# Patient Record
Sex: Male | Born: 1960 | Race: White | Hispanic: No | Marital: Married | Smoking: Never smoker
Health system: Southern US, Community
[De-identification: ages and names within clinical notes are randomized; demographics above are authoritative.]

## PROBLEM LIST (undated history)

## (undated) DIAGNOSIS — K219 Gastro-esophageal reflux disease without esophagitis: Secondary | ICD-10-CM

## (undated) DIAGNOSIS — I714 Abdominal aortic aneurysm, without rupture, unspecified: Secondary | ICD-10-CM

## (undated) DIAGNOSIS — K5792 Diverticulitis of intestine, part unspecified, without perforation or abscess without bleeding: Secondary | ICD-10-CM

## (undated) DIAGNOSIS — I4891 Unspecified atrial fibrillation: Secondary | ICD-10-CM

## (undated) DIAGNOSIS — K589 Irritable bowel syndrome without diarrhea: Secondary | ICD-10-CM

## (undated) DIAGNOSIS — I471 Supraventricular tachycardia, unspecified: Secondary | ICD-10-CM

## (undated) DIAGNOSIS — T7840XA Allergy, unspecified, initial encounter: Secondary | ICD-10-CM

## (undated) HISTORY — PX: OTHER SURGICAL HISTORY: SHX169

## (undated) HISTORY — PX: CARDIAC ELECTROPHYSIOLOGY STUDY AND ABLATION: SHX1294

## (undated) HISTORY — PX: FRACTURE SURGERY: SHX138

---

## 2006-01-28 ENCOUNTER — Emergency Department (HOSPITAL_COMMUNITY): Admission: EM | Admit: 2006-01-28 | Discharge: 2006-01-29 | Payer: Self-pay | Admitting: Emergency Medicine

## 2006-02-03 ENCOUNTER — Ambulatory Visit (HOSPITAL_BASED_OUTPATIENT_CLINIC_OR_DEPARTMENT_OTHER): Admission: RE | Admit: 2006-02-03 | Discharge: 2006-02-03 | Payer: Self-pay | Admitting: Orthopedic Surgery

## 2006-08-17 ENCOUNTER — Ambulatory Visit: Payer: Self-pay

## 2008-06-04 ENCOUNTER — Ambulatory Visit: Payer: Self-pay | Admitting: Family Medicine

## 2014-09-23 ENCOUNTER — Ambulatory Visit
Admission: RE | Admit: 2014-09-23 | Discharge: 2014-09-23 | Disposition: A | Discharge status: Home / Self Care | Payer: BLUE CROSS/BLUE SHIELD | Source: Ambulatory Visit | Attending: Unknown Physician Specialty | Admitting: Unknown Physician Specialty

## 2014-09-23 ENCOUNTER — Ambulatory Visit: Payer: BLUE CROSS/BLUE SHIELD | Admitting: *Deleted

## 2014-09-23 ENCOUNTER — Encounter
Admission: RE | Disposition: A | Discharge status: Home / Self Care | Payer: Self-pay | Source: Ambulatory Visit | Attending: Unknown Physician Specialty

## 2014-09-23 ENCOUNTER — Encounter: Payer: Self-pay | Admitting: *Deleted

## 2014-09-23 DIAGNOSIS — Z881 Allergy status to other antibiotic agents status: Secondary | ICD-10-CM | POA: Diagnosis not present

## 2014-09-23 DIAGNOSIS — D127 Benign neoplasm of rectosigmoid junction: Secondary | ICD-10-CM | POA: Insufficient documentation

## 2014-09-23 DIAGNOSIS — K640 First degree hemorrhoids: Secondary | ICD-10-CM | POA: Insufficient documentation

## 2014-09-23 DIAGNOSIS — K219 Gastro-esophageal reflux disease without esophagitis: Secondary | ICD-10-CM | POA: Insufficient documentation

## 2014-09-23 DIAGNOSIS — Z1211 Encounter for screening for malignant neoplasm of colon: Secondary | ICD-10-CM

## 2014-09-23 HISTORY — PX: COLONOSCOPY: SHX5424

## 2014-09-23 HISTORY — DX: Unspecified atrial fibrillation: I48.91

## 2014-09-23 SURGERY — COLONOSCOPY
Anesthesia: Monitor Anesthesia Care

## 2014-09-23 MED ORDER — SODIUM CHLORIDE 0.9 % IV SOLN: INTRAVENOUS | Status: DC

## 2014-09-23 MED ORDER — PROPOFOL INFUSION 10 MG/ML OPTIME
INTRAVENOUS | Status: DC | PRN
  Administered 2014-09-23: 140 ug/kg/min via INTRAVENOUS

## 2014-09-23 MED ORDER — PROPOFOL 10 MG/ML IV BOLUS
INTRAVENOUS | Status: DC | PRN
  Administered 2014-09-23: 50 mg via INTRAVENOUS

## 2014-09-23 MED ORDER — LACTATED RINGERS IV SOLN
INTRAVENOUS | Status: DC
  Administered 2014-09-23: 1000 mL via INTRAVENOUS
  Administered 2014-09-23: 12:00:00 via INTRAVENOUS

## 2014-09-23 MED ORDER — PHENYLEPHRINE HCL 10 MG/ML IJ SOLN
INTRAMUSCULAR | Status: DC | PRN
  Administered 2014-09-23 (×2): 100 ug via INTRAVENOUS

## 2014-09-23 MED ORDER — MIDAZOLAM HCL 5 MG/5ML IJ SOLN
INTRAMUSCULAR | Status: DC | PRN
  Administered 2014-09-23 (×2): 1 mg via INTRAVENOUS

## 2014-09-23 MED ORDER — FENTANYL CITRATE (PF) 100 MCG/2ML IJ SOLN
INTRAMUSCULAR | Status: DC | PRN
  Administered 2014-09-23: 50 ug via INTRAVENOUS

## 2014-09-23 NOTE — Anesthesia Postprocedure Evaluation (Signed)
  Anesthesia Post-op Note  Patient: Troy Giles  Procedure(s) Performed: Procedure(s): COLONOSCOPY (N/A)  Anesthesia type:MAC  Patient location: PACU  Post pain: Pain level controlled  Post assessment: Post-op Vital signs reviewed, Patient's Cardiovascular Status Stable, Respiratory Function Stable, Patent Airway and No signs of Nausea or vomiting  Post vital signs: Reviewed and stable  Last Vitals:  Filed Vitals:   09/23/14 1219  BP: 90/50  Pulse: 79  Temp:   Resp: 15    Level of consciousness: awake, alert  and patient cooperative  Complications: No apparent anesthesia complications

## 2014-09-23 NOTE — Anesthesia Preprocedure Evaluation (Signed)
Anesthesia Evaluation  Patient identified by MRN, date of birth, ID band Patient awake    Reviewed: Allergy & Precautions, NPO status , Patient's Chart, lab work & pertinent test results  Airway Mallampati: III  TM Distance: >3 FB Neck ROM: Full    Dental  (+) Teeth Intact   Pulmonary          Cardiovascular Exercise Tolerance: Good Rhythm:Regular Rate:Normal  Has had palpitations in the past--these have resolved.   Neuro/Psych    GI/Hepatic GERD-  Medicated,  Endo/Other    Renal/GU Renal InsufficiencyRenal disease     Musculoskeletal   Abdominal   Peds  Hematology   Anesthesia Other Findings   Reproductive/Obstetrics                             Anesthesia Physical Anesthesia Plan  ASA: III  Anesthesia Plan: MAC   Post-op Pain Management:    Induction:   Airway Management Planned: Nasal Cannula  Additional Equipment:   Intra-op Plan:   Post-operative Plan:   Informed Consent: I have reviewed the patients History and Physical, chart, labs and discussed the procedure including the risks, benefits and alternatives for the proposed anesthesia with the patient or authorized representative who has indicated his/her understanding and acceptance.     Plan Discussed with: CRNA  Anesthesia Plan Comments:         Anesthesia Quick Evaluation

## 2014-09-23 NOTE — Transfer of Care (Signed)
Immediate Anesthesia Transfer of Care Note  Patient: Troy Giles  Procedure(s) Performed: Procedure(s): COLONOSCOPY (N/A)  Patient Location: PACU  Anesthesia Type:General  Level of Consciousness: awake, alert  and oriented  Airway & Oxygen Therapy: Patient Spontanous Breathing and Patient connected to nasal cannula oxygen  Post-op Assessment: Report given to RN  Post vital signs: Reviewed and stable  Last Vitals:  Filed Vitals:   09/23/14 1029  BP: 140/85  Temp: 01.7 C    Complications: No apparent anesthesia complications

## 2014-09-23 NOTE — H&P (Signed)
Pt denies any major medical problems.Skin color good, oral neg, chest clear, heart RRR, abd neg, no HSM and non tender. VSS afebrile.

## 2014-09-23 NOTE — Op Note (Signed)
[facility] Gastroenterology Patient Name: Troy Giles Procedure Date: 09/23/2014 11:33 AM MRN: [id number] Account #: [id number] Date of Birth: [birth year] Admit Type: Outpatient Age: 54 Room: [facility] ENDO ROOM 3 Gender: Male Note Status: Finalized Procedure:         Colonoscopy Indications:       Screening for colorectal malignant neoplasm Providers:         Manya Silvas, MD Referring MD:      Tracie Harrier, MD (Referring MD) Medicines:         Propofol per Anesthesia Complications:     No immediate complications. Procedure:         Pre-Anesthesia Assessment:                    - After reviewing the risks and benefits, the patient was                     deemed in satisfactory condition to undergo the procedure.                    After obtaining informed consent, the colonoscope was                     passed under direct vision. Throughout the procedure, the                     patient's blood pressure, pulse, and oxygen saturations                     were monitored continuously. The Colonoscope was                     introduced through the anus and advanced to the the cecum,                     identified by appendiceal orifice and ileocecal valve. The                     colonoscopy was performed without difficulty. The patient                     tolerated the procedure well. The quality of the bowel                     preparation was good. Findings:      A diminutive polyp was found in the recto-sigmoid colon. The polyp was       sessile. The polyp was removed with a jumbo cold forceps. Resection and       retrieval were complete.      Internal hemorrhoids were found during endoscopy. The hemorrhoids were       small and Grade I (internal hemorrhoids that do not prolapse).      The exam was otherwise without abnormality. Impression:        - One diminutive polyp at the recto-sigmoid colon.                     Resected and retrieved.       - Internal hemorrhoids.                    - The examination was otherwise normal. Recommendation:    - Await pathology results. Manya Silvas, MD 09/23/2014 12:03:07 PM This report has been signed electronically. Number of Addenda:  0 Note Initiated On: 09/23/2014 11:33 AM Scope Withdrawal Time: 0 hours 10 minutes 31 seconds  Total Procedure Duration: 0 hours 14 minutes 5 seconds       [facility]

## 2014-09-23 NOTE — Anesthesia Postprocedure Evaluation (Signed)
  Anesthesia Post-op Note  Patient: Troy Giles  Procedure(s) Performed: Procedure(s): COLONOSCOPY (N/A)  Anesthesia type:MAC  Patient location: PACU  Post pain: Pain level controlled  Post assessment: Post-op Vital signs reviewed, Patient's Cardiovascular Status Stable, Respiratory Function Stable, Patent Airway and No signs of Nausea or vomiting  Post vital signs: Reviewed and stable  Last Vitals:  Filed Vitals:   09/23/14 1234  BP: 98/71  Pulse: 68  Temp:   Resp: 15    Level of consciousness: awake, alert  and patient cooperative  Complications: No apparent anesthesia complications

## 2014-09-23 NOTE — Discharge Instructions (Signed)
Do not drive or work for 24 hours, resume usual medications, resume usual diet.  We will notify you by mail of the results of the polyp analysis

## 2014-09-24 LAB — SURGICAL PATHOLOGY

## 2014-09-26 ENCOUNTER — Encounter: Payer: Self-pay | Admitting: Unknown Physician Specialty

## 2014-09-27 NOTE — H&P (Signed)
   Primary Care Physician:  Tracie Harrier, MD Primary Gastroenterologist:  Dr. Vira Agar  Pre-Procedure History & Physical: HPI:  Troy Giles is a 54 y.o. male is here for an colonoscopy.   Past Medical History  Diagnosis Date  . Atrial fibrillation     Past Surgical History  Procedure Laterality Date  . Colonoscopy N/A 09/23/2014    Procedure: COLONOSCOPY;  Surgeon: Manya Silvas, MD;  Location: [facility] ENDOSCOPY;  Service: Endoscopy;  Laterality: N/A;    Prior to Admission medications   Medication Sig Start Date End Date Taking? Authorizing Provider  aspirin 81 MG tablet Take 81 mg by mouth daily.   Yes Historical Provider, MD  cetirizine (ZYRTEC) 10 MG tablet Take 10 mg by mouth daily.   Yes Historical Provider, MD  dronedarone (MULTAQ) 400 MG tablet Take 400 mg by mouth 2 (two) times daily with a meal.   Yes Historical Provider, MD  fluticasone (FLONASE) 50 MCG/ACT nasal spray Place 1 spray into both nostrils daily.   Yes Historical Provider, MD  omeprazole (PRILOSEC) 20 MG capsule Take 20 mg by mouth 2 (two) times daily.   Yes Historical Provider, MD    Allergies as of 09/06/2014  . (Not on File)    History reviewed. No pertinent family history.  History   Social History  . Marital Status: Married    Spouse Name: N/A  . Number of Children: N/A  . Years of Education: N/A   Occupational History  . Not on file.   Social History Main Topics  . Smoking status: Never Smoker   . Smokeless tobacco: Not on file  . Alcohol Use: No  . Drug Use: No  . Sexual Activity: Not on file   Other Topics Concern  . Not on file   Social History Narrative    Review of Systems: See HPI, otherwise negative ROS  Physical Exam: BP 98/71 mmHg  Pulse 68  Temp(Src) 97.2 F (36.2 C) (Tympanic)  Resp 15  Ht 6\' 1"  (1.854 m)  Wt 108.863 kg (240 lb)  BMI 31.67 kg/m2  SpO2 98% General:   Alert,  pleasant and cooperative in NAD Head:  Normocephalic and atraumatic. Neck:   Supple; no masses or thyromegaly. Lungs:  Clear throughout to auscultation.    Heart:  Regular rate and rhythm. Abdomen:  Soft, nontender and nondistended. Normal bowel sounds, without guarding, and without rebound.   Neurologic:  Alert and  oriented x4;  grossly normal neurologically.  Impression/Plan: Troy Giles is here for an colonoscopy to be performed for screening  Risks, benefits, limitations, and alternatives regarding  colonoscopy have been reviewed with the patient.  Questions have been answered.  All parties agreeable.   Gaylyn Cheers, MD  09/27/2014, 11:15 AM

## 2016-01-06 ENCOUNTER — Ambulatory Visit
Admission: RE | Admit: 2016-01-06 | Discharge: 2016-01-06 | Disposition: A | Discharge status: Home / Self Care | Payer: BLUE CROSS/BLUE SHIELD | Source: Ambulatory Visit | Attending: Student | Admitting: Student

## 2016-01-06 ENCOUNTER — Other Ambulatory Visit: Payer: Self-pay | Admitting: Student

## 2016-01-06 DIAGNOSIS — R1013 Epigastric pain: Secondary | ICD-10-CM | POA: Diagnosis not present

## 2016-11-25 ENCOUNTER — Other Ambulatory Visit: Payer: Self-pay | Admitting: Student

## 2016-11-25 DIAGNOSIS — R109 Unspecified abdominal pain: Secondary | ICD-10-CM

## 2016-11-25 DIAGNOSIS — R1013 Epigastric pain: Secondary | ICD-10-CM

## 2016-11-25 DIAGNOSIS — R110 Nausea: Secondary | ICD-10-CM

## 2016-12-01 ENCOUNTER — Ambulatory Visit
Admission: RE | Admit: 2016-12-01 | Discharge: 2016-12-01 | Disposition: A | Discharge status: Home / Self Care | Payer: BLUE CROSS/BLUE SHIELD | Source: Ambulatory Visit | Attending: Student | Admitting: Student

## 2016-12-01 DIAGNOSIS — N400 Enlarged prostate without lower urinary tract symptoms: Secondary | ICD-10-CM | POA: Insufficient documentation

## 2016-12-01 DIAGNOSIS — R110 Nausea: Secondary | ICD-10-CM | POA: Diagnosis present

## 2016-12-01 DIAGNOSIS — I700 Atherosclerosis of aorta: Secondary | ICD-10-CM | POA: Insufficient documentation

## 2016-12-01 DIAGNOSIS — R1013 Epigastric pain: Secondary | ICD-10-CM | POA: Insufficient documentation

## 2016-12-01 DIAGNOSIS — R109 Unspecified abdominal pain: Secondary | ICD-10-CM | POA: Diagnosis present

## 2016-12-01 MED ORDER — IOPAMIDOL (ISOVUE-300) INJECTION 61%
100.0000 mL | Freq: Once | INTRAVENOUS | Status: AC | PRN
  Administered 2016-12-01: 100 mL via INTRAVENOUS

## 2016-12-14 ENCOUNTER — Other Ambulatory Visit
Admission: RE | Admit: 2016-12-14 | Discharge: 2016-12-14 | Disposition: A | Discharge status: Home / Self Care | Payer: BLUE CROSS/BLUE SHIELD | Source: Ambulatory Visit | Attending: Student | Admitting: Student

## 2016-12-14 DIAGNOSIS — R197 Diarrhea, unspecified: Secondary | ICD-10-CM | POA: Insufficient documentation

## 2016-12-14 LAB — GASTROINTESTINAL PANEL BY PCR, STOOL (REPLACES STOOL CULTURE)

## 2016-12-14 LAB — C DIFFICILE QUICK SCREEN W PCR REFLEX
C DIFFICILE (CDIFF) INTERP: NOT DETECTED
C DIFFICILE (CDIFF) TOXIN: NEGATIVE
C DIFFICLE (CDIFF) ANTIGEN: NEGATIVE

## 2016-12-21 LAB — MISC LABCORP TEST (SEND OUT): Labcorp test code: 123255

## 2017-01-19 ENCOUNTER — Other Ambulatory Visit
Admission: RE | Admit: 2017-01-19 | Discharge: 2017-01-19 | Disposition: A | Discharge status: Home / Self Care | Payer: BLUE CROSS/BLUE SHIELD | Source: Ambulatory Visit | Attending: Student | Admitting: Student

## 2017-01-19 DIAGNOSIS — A040 Enteropathogenic Escherichia coli infection: Secondary | ICD-10-CM | POA: Diagnosis present

## 2017-01-19 LAB — GASTROINTESTINAL PANEL BY PCR, STOOL (REPLACES STOOL CULTURE)

## 2017-01-27 ENCOUNTER — Other Ambulatory Visit: Payer: Self-pay | Admitting: Student

## 2017-01-27 DIAGNOSIS — R933 Abnormal findings on diagnostic imaging of other parts of digestive tract: Secondary | ICD-10-CM

## 2017-01-27 DIAGNOSIS — R1084 Generalized abdominal pain: Secondary | ICD-10-CM

## 2017-01-27 DIAGNOSIS — R197 Diarrhea, unspecified: Secondary | ICD-10-CM

## 2017-02-08 ENCOUNTER — Ambulatory Visit: Payer: BLUE CROSS/BLUE SHIELD

## 2017-02-09 ENCOUNTER — Ambulatory Visit
Admission: RE | Admit: 2017-02-09 | Discharge: 2017-02-09 | Disposition: A | Discharge status: Home / Self Care | Payer: BLUE CROSS/BLUE SHIELD | Source: Ambulatory Visit | Attending: Student | Admitting: Student

## 2017-02-09 DIAGNOSIS — N281 Cyst of kidney, acquired: Secondary | ICD-10-CM | POA: Insufficient documentation

## 2017-02-09 DIAGNOSIS — R1084 Generalized abdominal pain: Secondary | ICD-10-CM

## 2017-02-09 DIAGNOSIS — I700 Atherosclerosis of aorta: Secondary | ICD-10-CM | POA: Insufficient documentation

## 2017-02-09 DIAGNOSIS — R933 Abnormal findings on diagnostic imaging of other parts of digestive tract: Secondary | ICD-10-CM | POA: Diagnosis present

## 2017-02-09 DIAGNOSIS — R197 Diarrhea, unspecified: Secondary | ICD-10-CM

## 2017-02-09 LAB — POCT I-STAT CREATININE: CREATININE: 1.3 mg/dL — AB (ref 0.61–1.24)

## 2017-02-09 MED ORDER — IOPAMIDOL (ISOVUE-300) INJECTION 61%
100.0000 mL | Freq: Once | INTRAVENOUS | Status: AC | PRN
  Administered 2017-02-09: 100 mL via INTRAVENOUS

## 2017-04-25 ENCOUNTER — Ambulatory Visit: Payer: BLUE CROSS/BLUE SHIELD | Admitting: Certified Registered"

## 2017-04-25 ENCOUNTER — Encounter
Admission: RE | Disposition: A | Discharge status: Home / Self Care | Payer: Self-pay | Source: Ambulatory Visit | Attending: Unknown Physician Specialty

## 2017-04-25 ENCOUNTER — Ambulatory Visit
Admission: RE | Admit: 2017-04-25 | Discharge: 2017-04-25 | Disposition: A | Discharge status: Home / Self Care | Payer: BLUE CROSS/BLUE SHIELD | Source: Ambulatory Visit | Attending: Unknown Physician Specialty | Admitting: Unknown Physician Specialty

## 2017-04-25 DIAGNOSIS — R1084 Generalized abdominal pain: Secondary | ICD-10-CM | POA: Diagnosis present

## 2017-04-25 DIAGNOSIS — K640 First degree hemorrhoids: Secondary | ICD-10-CM | POA: Insufficient documentation

## 2017-04-25 DIAGNOSIS — K2950 Unspecified chronic gastritis without bleeding: Secondary | ICD-10-CM | POA: Insufficient documentation

## 2017-04-25 DIAGNOSIS — D122 Benign neoplasm of ascending colon: Secondary | ICD-10-CM | POA: Diagnosis not present

## 2017-04-25 DIAGNOSIS — I714 Abdominal aortic aneurysm, without rupture: Secondary | ICD-10-CM | POA: Diagnosis not present

## 2017-04-25 DIAGNOSIS — Z7982 Long term (current) use of aspirin: Secondary | ICD-10-CM | POA: Insufficient documentation

## 2017-04-25 DIAGNOSIS — I739 Peripheral vascular disease, unspecified: Secondary | ICD-10-CM | POA: Insufficient documentation

## 2017-04-25 DIAGNOSIS — K621 Rectal polyp: Secondary | ICD-10-CM | POA: Insufficient documentation

## 2017-04-25 DIAGNOSIS — Z888 Allergy status to other drugs, medicaments and biological substances status: Secondary | ICD-10-CM | POA: Diagnosis not present

## 2017-04-25 DIAGNOSIS — I471 Supraventricular tachycardia: Secondary | ICD-10-CM | POA: Diagnosis not present

## 2017-04-25 DIAGNOSIS — I4891 Unspecified atrial fibrillation: Secondary | ICD-10-CM | POA: Diagnosis not present

## 2017-04-25 DIAGNOSIS — K449 Diaphragmatic hernia without obstruction or gangrene: Secondary | ICD-10-CM | POA: Diagnosis not present

## 2017-04-25 HISTORY — DX: Allergy, unspecified, initial encounter: T78.40XA

## 2017-04-25 HISTORY — DX: Unspecified atrial fibrillation: I48.91

## 2017-04-25 HISTORY — DX: Gastro-esophageal reflux disease without esophagitis: K21.9

## 2017-04-25 HISTORY — PX: ESOPHAGOGASTRODUODENOSCOPY (EGD) WITH PROPOFOL: SHX5813

## 2017-04-25 HISTORY — DX: Supraventricular tachycardia: I47.1

## 2017-04-25 HISTORY — DX: Abdominal aortic aneurysm, without rupture, unspecified: I71.40

## 2017-04-25 HISTORY — DX: Abdominal aortic aneurysm, without rupture: I71.4

## 2017-04-25 HISTORY — DX: Diverticulitis of intestine, part unspecified, without perforation or abscess without bleeding: K57.92

## 2017-04-25 HISTORY — PX: COLONOSCOPY WITH PROPOFOL: SHX5780

## 2017-04-25 HISTORY — DX: Supraventricular tachycardia, unspecified: I47.10

## 2017-04-25 SURGERY — COLONOSCOPY WITH PROPOFOL
Anesthesia: General

## 2017-04-25 MED ORDER — GLYCOPYRROLATE 0.2 MG/ML IJ SOLN
INTRAMUSCULAR | Status: AC
  Filled 2017-04-25: qty 1

## 2017-04-25 MED ORDER — SODIUM CHLORIDE 0.9 % IV SOLN
INTRAVENOUS | Status: DC
  Administered 2017-04-25: 1000 mL via INTRAVENOUS

## 2017-04-25 MED ORDER — PROPOFOL 500 MG/50ML IV EMUL
INTRAVENOUS | Status: DC | PRN
  Administered 2017-04-25: 120 ug/kg/min via INTRAVENOUS

## 2017-04-25 MED ORDER — MIDAZOLAM HCL 2 MG/2ML IJ SOLN
INTRAMUSCULAR | Status: AC
  Filled 2017-04-25: qty 2

## 2017-04-25 MED ORDER — PROPOFOL 10 MG/ML IV BOLUS
INTRAVENOUS | Status: DC | PRN
Start: 2017-04-25 — End: 2017-04-25
  Administered 2017-04-25: 30 mg via INTRAVENOUS
  Administered 2017-04-25: 70 mg via INTRAVENOUS

## 2017-04-25 MED ORDER — LIDOCAINE HCL (PF) 2 % IJ SOLN
INTRAMUSCULAR | Status: AC
  Filled 2017-04-25: qty 10

## 2017-04-25 MED ORDER — MIDAZOLAM HCL 5 MG/5ML IJ SOLN
INTRAMUSCULAR | Status: DC | PRN
  Administered 2017-04-25: 2 mg via INTRAVENOUS

## 2017-04-25 MED ORDER — PROPOFOL 500 MG/50ML IV EMUL
INTRAVENOUS | Status: AC
  Filled 2017-04-25: qty 50

## 2017-04-25 MED ORDER — SODIUM CHLORIDE 0.9 % IV SOLN: INTRAVENOUS | Status: DC

## 2017-04-25 MED ORDER — LIDOCAINE 2% (20 MG/ML) 5 ML SYRINGE
INTRAMUSCULAR | Status: DC | PRN
  Administered 2017-04-25: 25 mg via INTRAVENOUS

## 2017-04-25 NOTE — Anesthesia Post-op Follow-up Note (Signed)
Anesthesia QCDR form completed.        

## 2017-04-25 NOTE — Op Note (Signed)
[facility] Gastroenterology Patient Name: Safi Culotta Procedure Date: 04/25/2017 7:36 AM MRN: [id number] Account #: [id number] Date of Birth: [birth year] Admit Type: Outpatient Age: 56 Room: [facility] ENDO ROOM 1 Gender: Male Note Status: Finalized Procedure:            Colonoscopy Indications:          Generalized abdominal pain Providers:            Manya Silvas, MD Referring MD:         Tracie Harrier, MD (Referring MD) Medicines:            Propofol per Anesthesia Complications:        No immediate complications. Procedure:            Pre-Anesthesia Assessment:                       - After reviewing the risks and benefits, the patient                        was deemed in satisfactory condition to undergo the                        procedure.                       After obtaining informed consent, the colonoscope was                        passed under direct vision. Throughout the procedure,                        the patient's blood pressure, pulse, and oxygen                        saturations were monitored continuously. The                        Colonoscope was introduced through the anus and                        advanced to the the cecum, identified by appendiceal                        orifice and ileocecal valve. The colonoscopy was                        performed without difficulty. The patient tolerated the                        procedure well. The quality of the bowel preparation                        was good. Findings:      A diminutive polyp was found in the ascending colon. The polyp was       sessile. The polyp was removed with a jumbo cold forceps. Resection and       retrieval were complete.      A diminutive polyp was found in the ascending colon. The polyp was       sessile. The polyp was removed with a cold snare. Also the jumbo forceps  was used. Resection and retrieval were complete.      A diminutive polyp was found in  the rectum. The polyp was sessile. The       polyp was removed with a jumbo cold forceps. Resection and retrieval       were complete.      Internal hemorrhoids were found during endoscopy. The hemorrhoids were       small and Grade I (internal hemorrhoids that do not prolapse). Impression:           - One diminutive polyp in the ascending colon, removed                        with a jumbo cold forceps. Resected and retrieved.                       - One diminutive polyp in the ascending colon, removed                        with a cold snare. Resected and retrieved.                       - One diminutive polyp in the rectum, removed with a                        jumbo cold forceps. Resected and retrieved.                       - Internal hemorrhoids. Recommendation:       - Await pathology results. Manya Silvas, MD 04/25/2017 8:23:03 AM This report has been signed electronically. Number of Addenda: 0 Note Initiated On: 04/25/2017 7:36 AM Scope Withdrawal Time: 0 hours 5 minutes 54 seconds  Total Procedure Duration: 0 hours 10 minutes 51 seconds       [facility]

## 2017-04-25 NOTE — Op Note (Signed)
[facility] Gastroenterology Patient Name: Troy Giles Procedure Date: 04/25/2017 7:36 AM MRN: [id number] Account #: [id number] Date of Birth: 06-18-1960 Admit Type: Outpatient Age: 56 Room: [facility] ENDO ROOM 1 Gender: Male Note Status: Finalized Procedure:            Upper GI endoscopy Indications:          Generalized abdominal pain Providers:            Manya Silvas, MD Referring MD:         Tracie Harrier, MD (Referring MD) Medicines:            Propofol per Anesthesia Complications:        No immediate complications. Procedure:            Pre-Anesthesia Assessment:                       - After reviewing the risks and benefits, the patient                        was deemed in satisfactory condition to undergo the                        procedure.                       - After reviewing the risks and benefits, the patient                        was deemed in satisfactory condition to undergo the                        procedure.                       After obtaining informed consent, the endoscope was                        passed under direct vision. Throughout the procedure,                        the patient's blood pressure, pulse, and oxygen                        saturations were monitored continuously. The Endoscope                        was introduced through the mouth, and advanced to the                        second part of duodenum. The upper GI endoscopy was                        accomplished without difficulty. The patient tolerated                        the procedure well. Findings:      The examined esophagus was normal. GEJ 40cm.      A small hiatal hernia was present.      Striped mildly erythematous mucosa without bleeding was found in the       gastric antrum. Biopsies were taken with a cold forceps  for histology.       Biopsies were taken with a cold forceps for Helicobacter pylori testing.       Done from antrum and body.   The examined duodenum was normal. Bulb and second portion. Impression:           - Normal esophagus.                       - Small hiatal hernia.                       - Erythematous mucosa in the antrum. Biopsied.                       - Normal examined duodenum. Recommendation:       - Await pathology results. Manya Silvas, MD 04/25/2017 7:55:16 AM This report has been signed electronically. Number of Addenda: 0 Note Initiated On: 04/25/2017 7:36 AM      [facility]

## 2017-04-25 NOTE — H&P (Signed)
Primary Care Physician:  Tracie Harrier, MD Primary Gastroenterologist:  Dr. Vira Agar  Pre-Procedure History & Physical: HPI:  Troy Giles is a 56 y.o. male is here for an endoscopy and colonoscopy.   Past Medical History:  Diagnosis Date  . A-fib ([place])   . AAA (abdominal aortic aneurysm) ([place])   . Allergic state   . Atrial fibrillation ([place])   . Diverticulitis   . GERD (gastroesophageal reflux disease)   . SVT (supraventricular tachycardia) ([facility])     Past Surgical History:  Procedure Laterality Date  . COLONOSCOPY N/A 09/23/2014   Procedure: COLONOSCOPY;  Surgeon: Manya Silvas, MD;  Location: [facility] ENDOSCOPY;  Service: Endoscopy;  Laterality: N/A;    Prior to Admission medications   Medication Sig Start Date End Date Taking? Authorizing Provider  aspirin 81 MG tablet Take 81 mg by mouth daily.   Yes [provider]  cetirizine (ZYRTEC) 10 MG tablet Take 10 mg by mouth daily.   Yes [provider]  diltiazem (CARDIZEM CD) 120 MG 24 hr capsule Take 120 mg by mouth daily.   Yes [provider]  dronedarone (MULTAQ) 400 MG tablet Take 400 mg by mouth 2 (two) times daily with a meal.   Yes [provider]  fluticasone (FLONASE) 50 MCG/ACT nasal spray Place 1 spray into both nostrils daily.   Yes [provider]  hyoscyamine (LEVSIN, ANASPAZ) 0.125 MG tablet Take 0.125 mg by mouth every 4 (four) hours as needed.   Yes [provider]  levocetirizine (XYZAL) 5 MG tablet Take 5 mg by mouth every evening.   Yes [provider]  montelukast (SINGULAIR) 10 MG tablet Take 10 mg by mouth at bedtime.   Yes [provider]  pantoprazole (PROTONIX) 40 MG tablet Take 40 mg by mouth daily.   Yes [provider]  sucralfate (CARAFATE) 1 g tablet Take 1 g by mouth 4 (four) times daily.   Yes [provider]  omeprazole (PRILOSEC) 20 MG capsule Take 20 mg by mouth 2 (two) times daily.    [provider]    Allergies as of 03/10/2017 - Review Complete 02/09/2017  Allergen Reaction Noted  . Minocin [minocycline] Hives 09/23/2014    History reviewed. No pertinent family history.  Social History   Socioeconomic History  . Marital status: Married    Spouse name: Not on file  . Number of children: Not on file  . Years of education: Not on file  . Highest education level: Not on file  Social Needs  . Financial resource strain: Not on file  . Food insecurity - worry: Not on file  . Food insecurity - inability: Not on file  . Transportation needs - medical: Not on file  . Transportation needs - non-medical: Not on file  Occupational History  . Not on file  Tobacco Use  . Smoking status: Never Smoker  Substance and Sexual Activity  . Alcohol use: No  . Drug use: No  . Sexual activity: Not on file  Other Topics Concern  . Not on file  Social History Narrative  . Not on file    Review of Systems: See HPI, otherwise negative ROS  Physical Exam: BP 136/77   Pulse 96   Temp (!) 96.4 F (35.8 C) (Tympanic)   Resp 16   Ht 6\' 1"  (1.854 m)   Wt 111.1 kg (245 lb)   SpO2 99%   BMI 32.32 kg/m  General:   Alert,  pleasant and cooperative in NAD Head:  Normocephalic and atraumatic. Neck:  Supple; no masses or thyromegaly. Lungs:  Clear throughout to auscultation.    Heart:  Regular rate and rhythm. Abdomen:  Soft, nontender and nondistended. Normal bowel sounds, without guarding, and without rebound.   Neurologic:  Alert and  oriented x4;  grossly normal neurologically.  Impression/Plan: Troy Giles is here for an endoscopy and colonoscopy to be performed for generalized abdominal pain and intermitant diarrhea.  The diarrhea has stopped and he has some constipation now.  Likely has IBS.  Risks, benefits, limitations, and alternatives regarding  endoscopy and colonoscopy have been reviewed with the patient.  Questions have been answered.  All parties  agreeable.   Gaylyn Cheers, MD  04/25/2017, 7:33 AM

## 2017-04-25 NOTE — Anesthesia Preprocedure Evaluation (Signed)
Anesthesia Evaluation  Patient identified by MRN, date of birth, ID band Patient awake    Reviewed: Allergy & Precautions, H&P , NPO status , Patient's Chart, lab work & pertinent test results, reviewed documented beta blocker date and time   Airway Mallampati: II   Neck ROM: full    Dental  (+) Teeth Intact   Pulmonary neg pulmonary ROS,    Pulmonary exam normal        Cardiovascular Exercise Tolerance: Good + Peripheral Vascular Disease  negative cardio ROS Normal cardiovascular exam+ dysrhythmias Atrial Fibrillation and Supra Ventricular Tachycardia  Rhythm:regular Rate:Normal  S/p cardiac oblation   Neuro/Psych negative neurological ROS  negative psych ROS   GI/Hepatic negative GI ROS, Neg liver ROS, GERD  Medicated,  Endo/Other  negative endocrine ROS  Renal/GU negative Renal ROS  negative genitourinary   Musculoskeletal   Abdominal   Peds  Hematology negative hematology ROS (+)   Anesthesia Other Findings Past Medical History: No date: A-fib ([facility]) No date: AAA (abdominal aortic aneurysm) ([facility]) No date: Allergic state No date: Atrial fibrillation ([place]) No date: Diverticulitis No date: GERD (gastroesophageal reflux disease) No date: SVT (supraventricular tachycardia) ([place]) Past Surgical History: 09/23/2014: COLONOSCOPY; N/A     Comment:  Procedure: COLONOSCOPY;  Surgeon: Manya Silvas, MD;               Location: [place];  Service: Endoscopy;                Laterality: N/A; BMI    Body Mass Index:  32.32 kg/m     Reproductive/Obstetrics negative OB ROS                             Anesthesia Physical Anesthesia Plan  ASA: III  Anesthesia Plan: General   Post-op Pain Management:    Induction:   PONV Risk Score and Plan:   Airway Management Planned:   Additional Equipment:   Intra-op Plan:   Post-operative Plan:   Informed Consent: I have reviewed the  patients History and Physical, chart, labs and discussed the procedure including the risks, benefits and alternatives for the proposed anesthesia with the patient or authorized representative who has indicated his/her understanding and acceptance.   Dental Advisory Given  Plan Discussed with: CRNA  Anesthesia Plan Comments:         Anesthesia Quick Evaluation

## 2017-04-25 NOTE — Transfer of Care (Signed)
Immediate Anesthesia Transfer of Care Note  Patient: Troy Giles  Procedure(s) Performed: COLONOSCOPY WITH PROPOFOL (N/A ) ESOPHAGOGASTRODUODENOSCOPY (EGD) WITH PROPOFOL (N/A )  Patient Location: PACU  Anesthesia Type:General  Level of Consciousness: awake and alert   Airway & Oxygen Therapy: Patient Spontanous Breathing and Patient connected to nasal cannula oxygen  Post-op Assessment: Report given to RN and Post -op Vital signs reviewed and stable  Post vital signs: Reviewed  Last Vitals:  Vitals:   04/25/17 0658 04/25/17 0823  BP: 136/77 (!) 98/59  Pulse: 96 86  Resp: 16 19  Temp: (!) 35.8 C (!) 36.1 C  SpO2: 99% 97%    Last Pain:  Vitals:   04/25/17 0823  TempSrc: Tympanic  PainSc:          Complications: No apparent anesthesia complications

## 2017-04-25 NOTE — Anesthesia Postprocedure Evaluation (Signed)
Anesthesia Post Note  Patient: Troy Giles  Procedure(s) Performed: COLONOSCOPY WITH PROPOFOL (N/A ) ESOPHAGOGASTRODUODENOSCOPY (EGD) WITH PROPOFOL (N/A )  Patient location during evaluation: PACU Anesthesia Type: General Level of consciousness: awake and alert Pain management: pain level controlled Vital Signs Assessment: post-procedure vital signs reviewed and stable Respiratory status: spontaneous breathing, nonlabored ventilation, respiratory function stable and patient connected to nasal cannula oxygen Cardiovascular status: blood pressure returned to baseline and stable Postop Assessment: no apparent nausea or vomiting Anesthetic complications: no     Last Vitals:  Vitals:   04/25/17 0658 04/25/17 0823  BP: 136/77 (!) 98/59  Pulse: 96 86  Resp: 16 19  Temp: (!) 35.8 C (!) 36.1 C  SpO2: 99% 97%    Last Pain:  Vitals:   04/25/17 0823  TempSrc: Tympanic  PainSc:                  Molli Barrows

## 2017-04-26 ENCOUNTER — Encounter: Payer: Self-pay | Admitting: Unknown Physician Specialty

## 2017-04-26 LAB — SURGICAL PATHOLOGY

## 2018-03-20 IMAGING — CT CT ENTEROGRAPHY (ABD-PELV W/ CM)
1 of 4 series · 13 of 32 positions shown, 18 images · IV contrast (APPLIED)
Comparison: 11/21/2016

CLINICAL DATA: Nausea and lower abdominal cramping

EXAM:
CT ABDOMEN AND PELVIS WITH CONTRAST (ENTEROGRAPHY)
TECHNIQUE: Multidetector CT of the abdomen and pelvis during bolus
administration of intravenous contrast. Negative oral contrast was
given.
CONTRAST:  100mL U6QTOX-TLL IOPAMIDOL (U6QTOX-TLL) INJECTION 61%

[Series 3: entero thins · axial · 0.88mm/px · z∈[-1014,-528]mm · 13 of 275 slices shown, 18 images]
[im 16/275  soft-tissue]
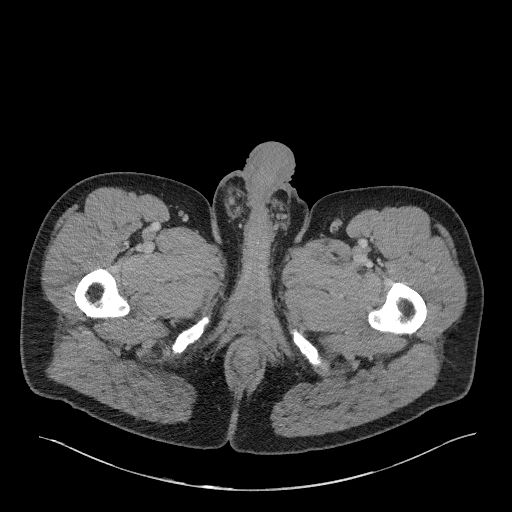
[im 16/275  bone]
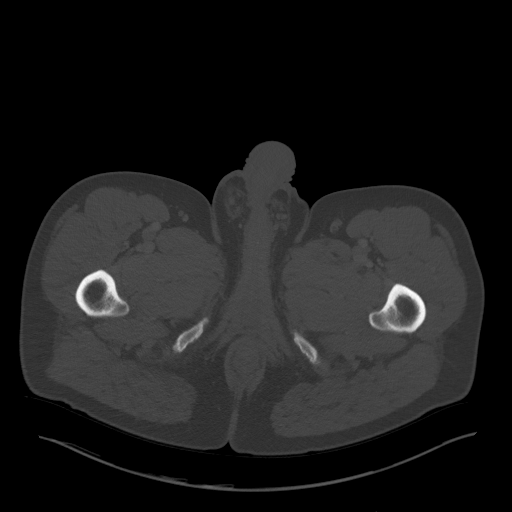
[im 46/275  soft-tissue]
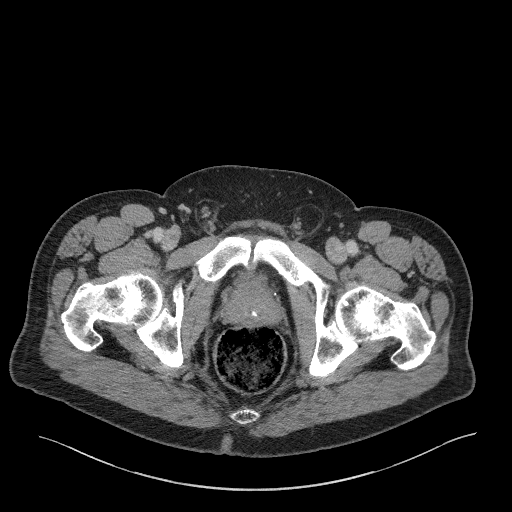
[im 61/275  soft-tissue]
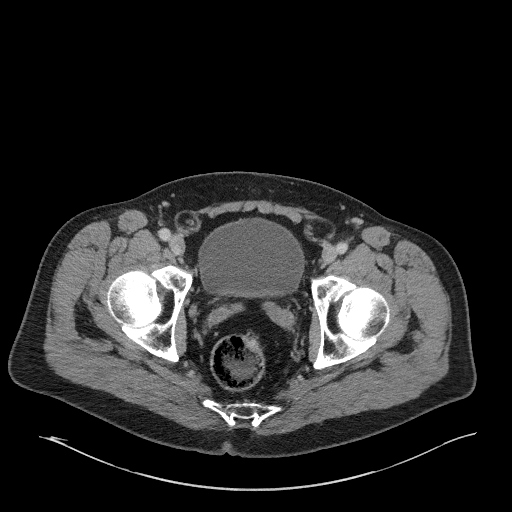
[im 77/275  soft-tissue]
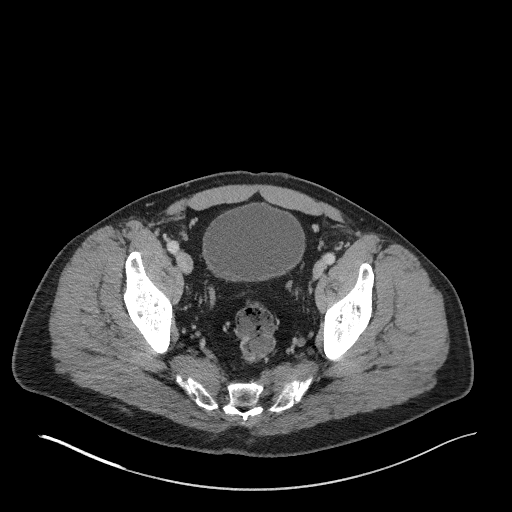
[im 107/275  soft-tissue]
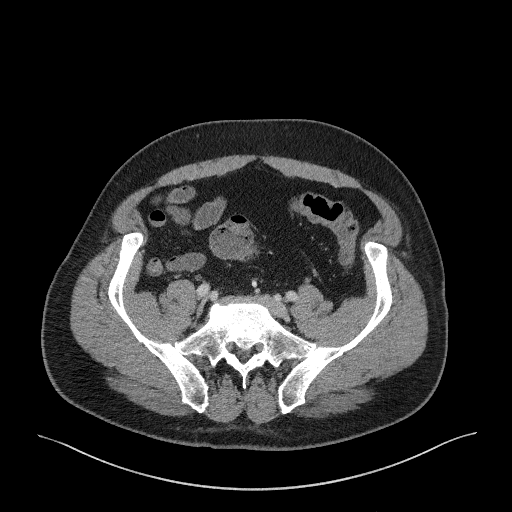
[im 122/275  soft-tissue]
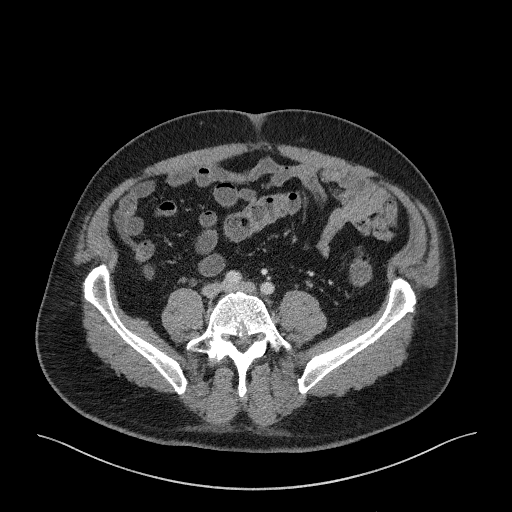
[im 153/275  soft-tissue]
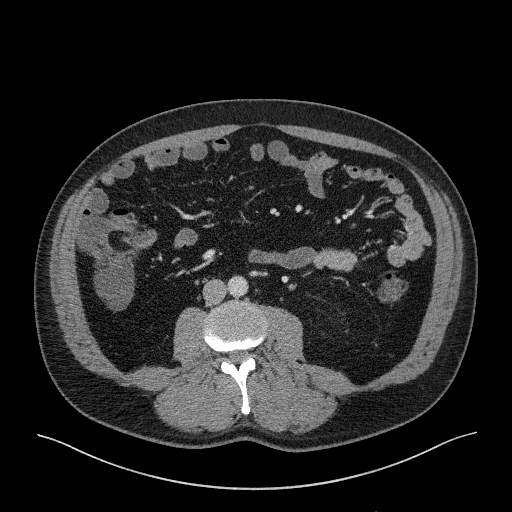
[im 168/275  soft-tissue]
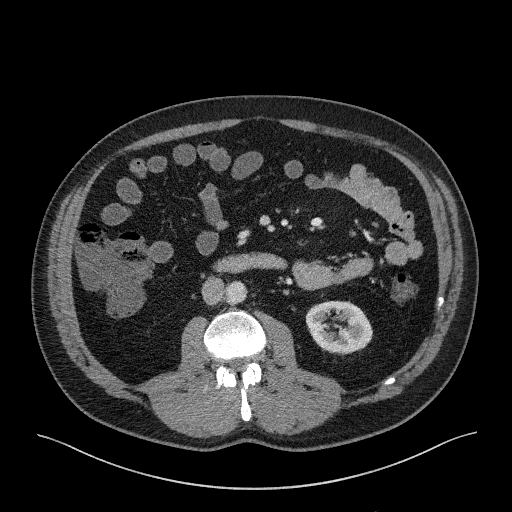
[im 198/275  soft-tissue]
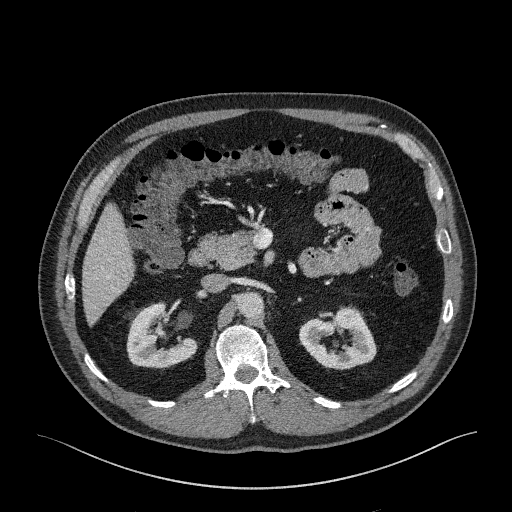
[im 198/275  bone]
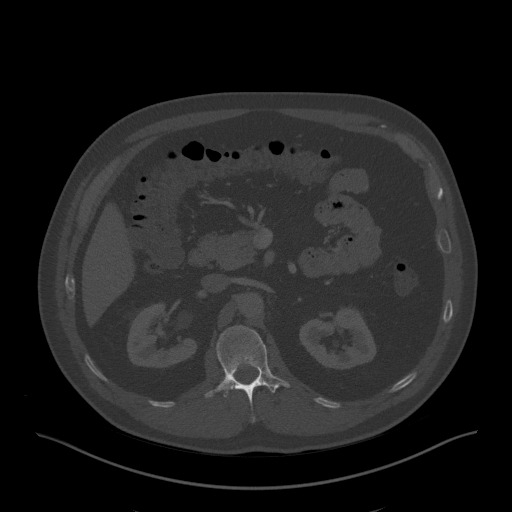
[im 214/275  soft-tissue]
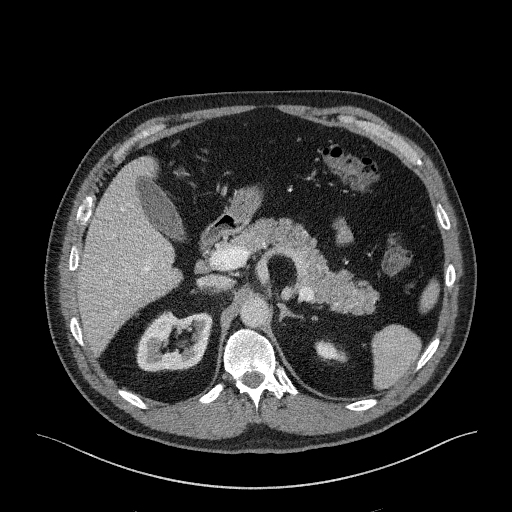
[im 214/275  lung]
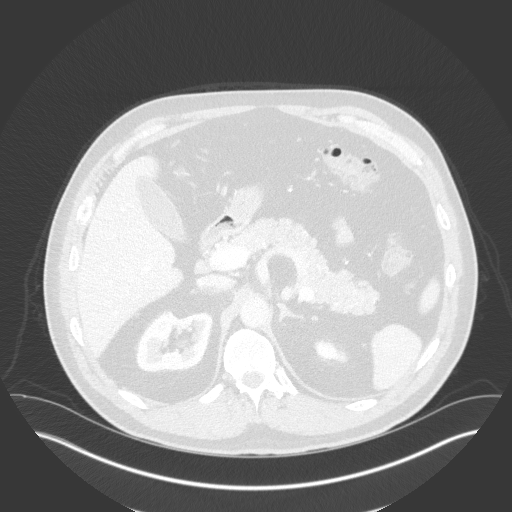
[im 229/275  soft-tissue]
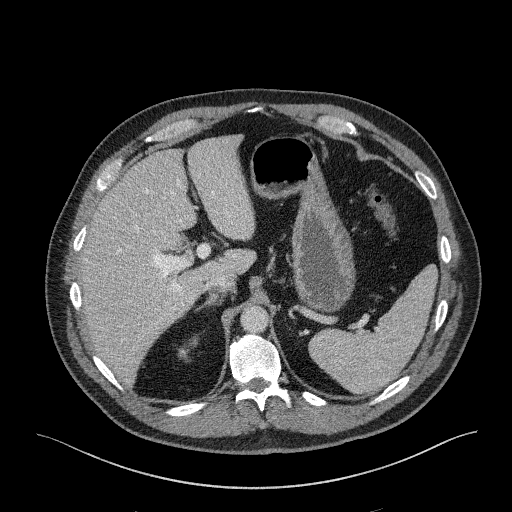
[im 229/275  lung]
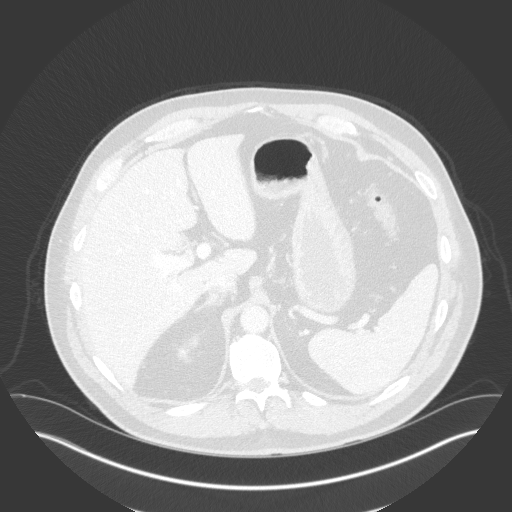
[im 244/275  lung]
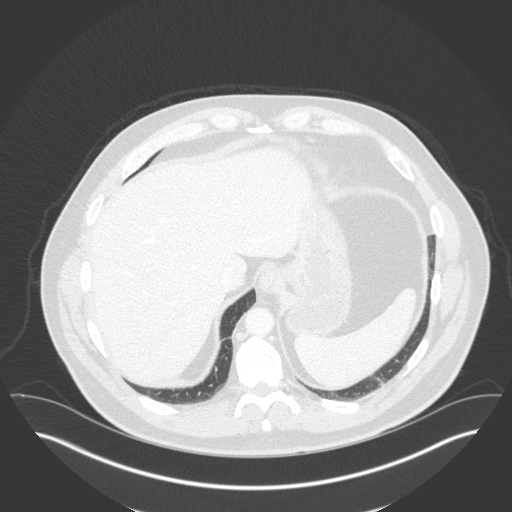
[im 259/275  soft-tissue]
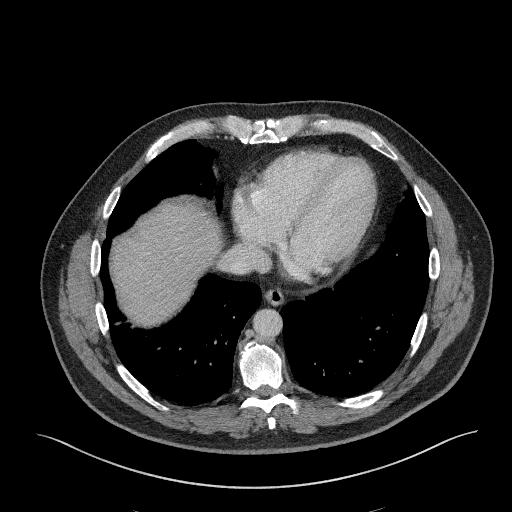
[im 259/275  lung]
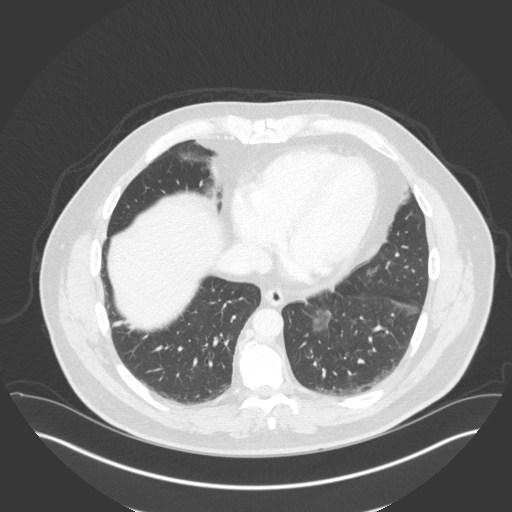

[13 of 32 positions shown; findings below may reference images not displayed]

FINDINGS: Lower chest:  Subsegmental atelectasis noted in the lung bases.

Hepatobiliary: No focal abnormality within the liver parenchyma.
There is no evidence for gallstones, gallbladder wall thickening, or
pericholecystic fluid. No intrahepatic or extrahepatic biliary
dilation.

Pancreas: No focal mass lesion. No dilatation of the main duct. No
intraparenchymal cyst. No peripancreatic edema.

Spleen: No splenomegaly. No focal mass lesion.

Adrenals/Urinary Tract: No adrenal nodule or mass. 12 mm hypodensity
in the interpolar right kidney is stable and compatible with a cyst.
Left kidney unremarkable. No evidence for hydroureter. The urinary
bladder appears normal for the degree of distention.

Stomach/Bowel: No abnormal wall thickening or abnormal enhancement
in the stomach. The duodenum is normal. Small bowel loops show no
wall thickening or abnormal enhancement. Terminal ileum is normal.
The appendix is normal. No wall thickening or abnormal enhancement
in the colon.

Vascular/Lymphatic: There is abdominal aortic atherosclerosis
without aneurysm. There is no gastrohepatic or hepatoduodenal
ligament lymphadenopathy. No intraperitoneal or retroperitoneal
lymphadenopathy. No pelvic sidewall lymphadenopathy. The tiny
mesenteric lymph nodes and subtle density in the mesenteric fat
described on the prior study are stable and likely a chronic finding
in this individual.

Reproductive: Prostate gland mildly enlarged.

Other: No intraperitoneal free fluid.

Musculoskeletal: Bone windows reveal no worrisome lytic or sclerotic
osseous lesions.
IMPRESSION: 1. No abnormal wall thickening or abnormal enhancement in the
stomach, small bowel, or colon.
2. Stable tiny right renal cyst.
3.  Aortic Atherosclerois ([id number])

## 2023-03-07 ENCOUNTER — Other Ambulatory Visit: Payer: Self-pay | Admitting: Internal Medicine

## 2023-03-07 DIAGNOSIS — I471 Supraventricular tachycardia, unspecified: Secondary | ICD-10-CM

## 2023-08-25 ENCOUNTER — Encounter: Payer: Self-pay | Admitting: Gastroenterology

## 2023-08-29 ENCOUNTER — Encounter: Payer: Self-pay | Admitting: Gastroenterology

## 2023-09-02 ENCOUNTER — Encounter: Payer: Self-pay | Admitting: Gastroenterology

## 2023-09-05 ENCOUNTER — Encounter
Admission: RE | Disposition: A | Discharge status: Home / Self Care | Payer: Self-pay | Source: Home / Self Care | Attending: Gastroenterology

## 2023-09-05 ENCOUNTER — Ambulatory Visit: Admitting: Anesthesiology

## 2023-09-05 ENCOUNTER — Encounter: Payer: Self-pay | Admitting: Gastroenterology

## 2023-09-05 ENCOUNTER — Ambulatory Visit
Admission: RE | Admit: 2023-09-05 | Discharge: 2023-09-05 | Disposition: A | Discharge status: Home / Self Care | Payer: Self-pay | Attending: Gastroenterology | Admitting: Gastroenterology

## 2023-09-05 DIAGNOSIS — K635 Polyp of colon: Secondary | ICD-10-CM | POA: Diagnosis not present

## 2023-09-05 DIAGNOSIS — I714 Abdominal aortic aneurysm, without rupture, unspecified: Secondary | ICD-10-CM | POA: Insufficient documentation

## 2023-09-05 DIAGNOSIS — D122 Benign neoplasm of ascending colon: Secondary | ICD-10-CM | POA: Insufficient documentation

## 2023-09-05 DIAGNOSIS — K589 Irritable bowel syndrome without diarrhea: Secondary | ICD-10-CM | POA: Insufficient documentation

## 2023-09-05 DIAGNOSIS — K5730 Diverticulosis of large intestine without perforation or abscess without bleeding: Secondary | ICD-10-CM | POA: Insufficient documentation

## 2023-09-05 DIAGNOSIS — Z1211 Encounter for screening for malignant neoplasm of colon: Secondary | ICD-10-CM | POA: Diagnosis present

## 2023-09-05 DIAGNOSIS — I4891 Unspecified atrial fibrillation: Secondary | ICD-10-CM | POA: Insufficient documentation

## 2023-09-05 DIAGNOSIS — K219 Gastro-esophageal reflux disease without esophagitis: Secondary | ICD-10-CM | POA: Insufficient documentation

## 2023-09-05 DIAGNOSIS — D125 Benign neoplasm of sigmoid colon: Secondary | ICD-10-CM | POA: Insufficient documentation

## 2023-09-05 HISTORY — PX: POLYPECTOMY: SHX149

## 2023-09-05 HISTORY — DX: Supraventricular tachycardia, unspecified: I47.10

## 2023-09-05 HISTORY — PX: COLONOSCOPY: SHX5424

## 2023-09-05 HISTORY — DX: Irritable bowel syndrome, unspecified: K58.9

## 2023-09-05 SURGERY — COLONOSCOPY
Anesthesia: General

## 2023-09-05 MED ORDER — PROPOFOL 10 MG/ML IV BOLUS
INTRAVENOUS | Status: DC | PRN
  Administered 2023-09-05 (×2): 40 mg via INTRAVENOUS

## 2023-09-05 MED ORDER — LIDOCAINE HCL (CARDIAC) PF 100 MG/5ML IV SOSY
PREFILLED_SYRINGE | INTRAVENOUS | Status: DC | PRN
  Administered 2023-09-05: 100 mg via INTRAVENOUS

## 2023-09-05 MED ORDER — LIDOCAINE HCL (PF) 2 % IJ SOLN
INTRAMUSCULAR | Status: AC
Start: 2023-09-05 — End: ?
  Filled 2023-09-05: qty 5

## 2023-09-05 MED ORDER — SODIUM CHLORIDE 0.9 % IV SOLN
INTRAVENOUS | Status: DC
  Administered 2023-09-05: 20 mL/h via INTRAVENOUS

## 2023-09-05 MED ORDER — PROPOFOL 500 MG/50ML IV EMUL
INTRAVENOUS | Status: DC | PRN
  Administered 2023-09-05: 75 ug/kg/min via INTRAVENOUS

## 2023-09-05 MED ORDER — PHENYLEPHRINE 80 MCG/ML (10ML) SYRINGE FOR IV PUSH (FOR BLOOD PRESSURE SUPPORT)
PREFILLED_SYRINGE | INTRAVENOUS | Status: DC | PRN
  Administered 2023-09-05 (×4): 160 ug via INTRAVENOUS
  Administered 2023-09-05: 80 ug via INTRAVENOUS

## 2023-09-05 MED ORDER — PHENYLEPHRINE 80 MCG/ML (10ML) SYRINGE FOR IV PUSH (FOR BLOOD PRESSURE SUPPORT)
PREFILLED_SYRINGE | INTRAVENOUS | Status: AC
  Filled 2023-09-05: qty 10

## 2023-09-05 NOTE — Op Note (Addendum)
 [facility] Gastroenterology Patient Name: Troy Giles Procedure Date: 09/05/2023 11:07 AM MRN: [id number] Account #: [id number] Date of Birth: 1961-04-27 Admit Type: Outpatient Age: 63 Room: [facility] ENDO ROOM 2 Gender: Male Note Status: Finalized Instrument Name: Colonoscope [id number] Procedure:             Colonoscopy Indications:           High risk colon cancer surveillance: Personal history                         of colonic polyps Providers:             Trenda Moots, DO Referring MD:          Jaynie Collins DO, DO (Referring MD) Medicines:             Monitored Anesthesia Care Complications:         No immediate complications. Estimated blood loss:                         Minimal. Procedure:             Pre-Anesthesia Assessment:                        - Prior to the procedure, a History and Physical was                         performed, and patient medications and allergies were                         reviewed. The patient is competent. The risks and                         benefits of the procedure and the sedation options and                         risks were discussed with the patient. All questions                         were answered and informed consent was obtained.                         Patient identification and proposed procedure were                         verified by the physician, the nurse, the anesthetist                         and the technician in the endoscopy suite. Mental                         Status Examination: alert and oriented. Airway                         Examination: normal oropharyngeal airway and neck                         mobility. Respiratory Examination: clear to  auscultation. CV Examination: RRR, no murmurs, no S3                         or S4. Prophylactic Antibiotics: The patient does not                         require prophylactic antibiotics. Prior                          Anticoagulants: The patient has taken no anticoagulant                         or antiplatelet agents. ASA Grade Assessment: III - A                         patient with severe systemic disease. After reviewing                         the risks and benefits, the patient was deemed in                         satisfactory condition to undergo the procedure. The                         anesthesia plan was to use monitored anesthesia care                         (MAC). Immediately prior to administration of                         medications, the patient was re-assessed for adequacy                         to receive sedatives. The heart rate, respiratory                         rate, oxygen saturations, blood pressure, adequacy of                         pulmonary ventilation, and response to care were                         monitored throughout the procedure. The physical                         status of the patient was re-assessed after the                         procedure.                        After obtaining informed consent, the colonoscope was                         passed under direct vision. Throughout the procedure,                         the patient's blood pressure, pulse, and oxygen  saturations were monitored continuously. The                         Colonoscope was introduced through the anus and                         advanced to the the terminal ileum, with                         identification of the appendiceal orifice and IC                         valve. The colonoscopy was performed without                         difficulty. The patient tolerated the procedure well.                         The quality of the bowel preparation was evaluated                         using the BBPS [facility] Bowel Preparation Scale) with                         scores of: Right Colon = 3, Transverse Colon = 3 and                         Left Colon = 3  (entire mucosa seen well with no                         residual staining, small fragments of stool or opaque                         liquid). The total BBPS score equals 9. The terminal                         ileum, ileocecal valve, appendiceal orifice, and                         rectum were photographed. Findings:      The terminal ileum appeared normal. Estimated blood loss: none.      Retroflexion in the right colon was performed.      Five sessile polyps were found in the sigmoid colon, descending colon       and ascending colon. The polyps were 3 to 6 mm in size. These polyps       were removed with a cold snare. Resection and retrieval were complete.       Estimated blood loss was minimal.      A 1 mm polyp was found in the appendiceal orifice. The polyp was       sessile. The polyp was removed with a jumbo cold forceps. Resection and       retrieval were complete. Estimated blood loss was minimal.      A few small-mouthed diverticula were found in the sigmoid colon.       Estimated blood loss: none.      The exam was otherwise without abnormality on direct and retroflexion  views. Impression:            - The examined portion of the ileum was normal.                        - Five 3 to 6 mm polyps in the sigmoid colon, in the                         descending colon and in the ascending colon, removed                         with a cold snare. Resected and retrieved.                        - One 1 mm polyp at the appendiceal orifice, removed                         with a jumbo cold forceps. Resected and retrieved.                        - Diverticulosis in the sigmoid colon.                        - The examination was otherwise normal on direct and                         retroflexion views. Recommendation:        - Patient has a contact number available for                         emergencies. The signs and symptoms of potential                         delayed  complications were discussed with the patient.                         Return to normal activities tomorrow. Written                         discharge instructions were provided to the patient.                        - Discharge patient to home.                        - Resume previous diet.                        - Continue present medications.                        - No ibuprofen, naproxen, or other non-steroidal                         anti-inflammatory drugs for 5 days after polyp removal.                        - Await pathology results.                        -  Repeat colonoscopy for surveillance based on                         pathology results.                        - Return to referring physician as previously                         scheduled.                        - The findings and recommendations were discussed with                         the patient. Procedure Code(s):     --- Professional ---                        [id number], Colonoscopy, flexible; with removal of                         tumor(s), polyp(s), or other lesion(s) by snare                         technique                        45380, 59, Colonoscopy, flexible; with biopsy, single                         or multiple Diagnosis Code(s):     --- Professional ---                        Z86.010, Personal history of colonic polyps                        D12.5, Benign neoplasm of sigmoid colon                        D12.4, Benign neoplasm of descending colon                        D12.2, Benign neoplasm of ascending colon                        D12.1, Benign neoplasm of appendix                        K57.30, Diverticulosis of large intestine without                         perforation or abscess without bleeding CPT copyright 2022 American Medical Association. All rights reserved. The codes documented in this report are preliminary and upon coder review may  be revised to meet current compliance  requirements. Attending Participation:      I personally performed the entire procedure. Elfredia Nevins, DO Jaynie Collins DO, DO 09/05/2023 11:59:28 AM This report has been signed electronically. Number of Addenda: 0 Note Initiated On: 09/05/2023 11:07 AM Scope Withdrawal Time: 0 hours 22 minutes 21 seconds  Total Procedure Duration: 0 hours 25 minutes 47 seconds  Estimated Blood Loss:  Estimated blood loss was minimal.      [facility]

## 2023-09-05 NOTE — Anesthesia Postprocedure Evaluation (Signed)
 Anesthesia Post Note  Patient: Troy Giles  Procedure(s) Performed: COLONOSCOPY POLYPECTOMY, INTESTINE  Patient location during evaluation: Endoscopy Anesthesia Type: General Level of consciousness: awake and alert Pain management: pain level controlled Vital Signs Assessment: post-procedure vital signs reviewed and stable Respiratory status: spontaneous breathing, nonlabored ventilation, respiratory function stable and patient connected to nasal cannula oxygen Cardiovascular status: blood pressure returned to baseline and stable Postop Assessment: no apparent nausea or vomiting Anesthetic complications: no  No notable events documented.   Last Vitals:  Vitals:   09/05/23 1206 09/05/23 1216  BP: 96/66 111/73  Pulse: 72 71  Resp: 17 20  Temp:    SpO2: 97% 96%    Last Pain:  Vitals:   09/05/23 1216  TempSrc:   PainSc: 0-No pain                 Enrique Harvest

## 2023-09-05 NOTE — H&P (Signed)
 Pre-Procedure H&P   Patient ID: Troy Giles is a 63 y.o. male.  Gastroenterology Provider: Jaynie Collins, DO  Referring Provider: Jacob Moores, PA PCP: Barbette Reichmann, MD  Date: 09/05/2023  HPI Mr. Troy Giles is a 62 y.o. male who presents today for Colonoscopy for Personal colon polyps .  Last underwent colonoscopy in December 2018 with 2 adenomatous polyps.  Internal hemorrhoids also noted.  Other polyps were hyperplastic.  Negative colonoscopy in May 2016  Every other day bowel meant without melena or hematochezia.  Hemoglobin 17 MCV 84 platelets 192,000 creatinine 1.1   Past Medical History:  Diagnosis Date   A-fib [facility])    AAA (abdominal aortic aneurysm) (HCC)    Allergic state    Atrial fibrillation (HCC)    Diverticulitis    GERD (gastroesophageal reflux disease)    Irritable bowel syndrome    Supraventricular tachycardia (HCC)    SVT (supraventricular tachycardia) (HCC)     Past Surgical History:  Procedure Laterality Date   CARDIAC ELECTROPHYSIOLOGY STUDY AND ABLATION N/A    COLONOSCOPY N/A 09/23/2014   Procedure: COLONOSCOPY;  Surgeon: Scot Jun, MD;  Location: [facility] ENDOSCOPY;  Service: Endoscopy;  Laterality: N/A;   COLONOSCOPY WITH PROPOFOL N/A 04/25/2017   Procedure: COLONOSCOPY WITH PROPOFOL;  Surgeon: Scot Jun, MD;  Location: [facility] ENDOSCOPY;  Service: Endoscopy;  Laterality: N/A;   ESOPHAGOGASTRODUODENOSCOPY (EGD) WITH PROPOFOL N/A 04/25/2017   Procedure: ESOPHAGOGASTRODUODENOSCOPY (EGD) WITH PROPOFOL;  Surgeon: Scot Jun, MD;  Location: [facility] ENDOSCOPY;  Service: Endoscopy;  Laterality: N/A;   FRACTURE SURGERY     Jaw realignment N/A     Family History No h/o GI disease or malignancy  Review of Systems  Constitutional:  Negative for activity change, appetite change, chills, diaphoresis, fatigue, fever and unexpected weight change.  HENT:  Negative for trouble swallowing and voice change.    Respiratory:  Negative for shortness of breath and wheezing.   Cardiovascular:  Negative for chest pain, palpitations and leg swelling.  Gastrointestinal:  Negative for abdominal distention, abdominal pain, anal bleeding, blood in stool, constipation, diarrhea, nausea and vomiting.  Musculoskeletal:  Negative for arthralgias and myalgias.  Skin:  Negative for color change and pallor.  Neurological:  Negative for dizziness, syncope and weakness.  Psychiatric/Behavioral:  Negative for confusion. The patient is not nervous/anxious.   All other systems reviewed and are negative.    Medications No current facility-administered medications on file prior to encounter.   Current Outpatient Medications on File Prior to Encounter  Medication Sig Dispense Refill   aspirin 81 MG tablet Take 81 mg by mouth daily.     cetirizine (ZYRTEC) 10 MG tablet Take 10 mg by mouth daily.     diltiazem (CARDIZEM CD) 120 MG 24 hr capsule Take 120 mg by mouth daily.     dronedarone (MULTAQ) 400 MG tablet Take 400 mg by mouth 2 (two) times daily with a meal.     fluticasone (FLONASE) 50 MCG/ACT nasal spray Place 1 spray into both nostrils daily.     hyoscyamine (LEVSIN, ANASPAZ) 0.125 MG tablet Take 0.125 mg by mouth every 4 (four) hours as needed.     levocetirizine (XYZAL) 5 MG tablet Take 5 mg by mouth every evening.     montelukast (SINGULAIR) 10 MG tablet Take 10 mg by mouth at bedtime.     omeprazole (PRILOSEC) 20 MG capsule Take 20 mg by mouth 2 (two) times daily.     pantoprazole (PROTONIX) 40  MG tablet Take 40 mg by mouth daily.     sucralfate (CARAFATE) 1 g tablet Take 1 g by mouth 4 (four) times daily.      Pertinent medications related to GI and procedure were reviewed by me with the patient prior to the procedure   Current Facility-Administered Medications:    0.9 %  sodium chloride infusion, , Intravenous, Continuous, Jaynie Collins, DO, Last Rate: 20 mL/hr at 09/05/23 1042, 20 mL/hr at  09/05/23 1042  sodium chloride 20 mL/hr (09/05/23 1042)       Allergies  Allergen Reactions   Minocin [Minocycline] Hives   Allergies were reviewed by me prior to the procedure  Objective   Body mass index is 32.32 kg/m. Vitals:   09/05/23 1029  BP: 122/83  Pulse: 83  Resp: 20  Temp: (!) 96.8 F (36 C)  TempSrc: Temporal  SpO2: 99%  Weight: 111.1 kg  Height: 6\' 1"  (1.854 m)     Physical Exam Vitals and nursing note reviewed.  Constitutional:      General: He is not in acute distress.    Appearance: Normal appearance. He is not ill-appearing, toxic-appearing or diaphoretic.  HENT:     Head: Normocephalic and atraumatic.     Nose: Nose normal.     Mouth/Throat:     Mouth: Mucous membranes are moist.     Pharynx: Oropharynx is clear.  Eyes:     General: No scleral icterus.    Extraocular Movements: Extraocular movements intact.  Cardiovascular:     Rate and Rhythm: Normal rate and regular rhythm.     Heart sounds: Normal heart sounds. No murmur heard.    No friction rub. No gallop.  Pulmonary:     Effort: Pulmonary effort is normal. No respiratory distress.     Breath sounds: Normal breath sounds. No wheezing, rhonchi or rales.  Abdominal:     General: Bowel sounds are normal. There is no distension.     Palpations: Abdomen is soft.     Tenderness: There is no abdominal tenderness. There is no guarding or rebound.  Musculoskeletal:     Cervical back: Neck supple.     Right lower leg: No edema.     Left lower leg: No edema.  Skin:    General: Skin is warm and dry.     Coloration: Skin is not jaundiced or pale.  Neurological:     General: No focal deficit present.     Mental Status: He is alert and oriented to person, place, and time. Mental status is at baseline.  Psychiatric:        Mood and Affect: Mood normal.        Behavior: Behavior normal.        Thought Content: Thought content normal.        Judgment: Judgment normal.      Assessment:   Mr. Troy Giles is a 63 y.o. male  who presents today for Colonoscopy for Personal colon polyps .  Plan:  Colonoscopy with possible intervention today  Colonoscopy with possible biopsy, control of bleeding, polypectomy, and interventions as necessary has been discussed with the patient/patient representative. Informed consent was obtained from the patient/patient representative after explaining the indication, nature, and risks of the procedure including but not limited to death, bleeding, perforation, missed neoplasm/lesions, cardiorespiratory compromise, and reaction to medications. Opportunity for questions was given and appropriate answers were provided. Patient/patient representative has verbalized understanding is amenable to undergoing the procedure.  Quintin Buckle, DO  [facility] Gastroenterology  Portions of the record may have been created with voice recognition software. Occasional wrong-word or 'sound-a-like' substitutions may have occurred due to the inherent limitations of voice recognition software.  Read the chart carefully and recognize, using context, where substitutions may have occurred.

## 2023-09-05 NOTE — Transfer of Care (Signed)
 Immediate Anesthesia Transfer of Care Note  Patient: Troy Giles  Procedure(s) Performed: COLONOSCOPY POLYPECTOMY, INTESTINE  Patient Location: PACU  Anesthesia Type:General  Level of Consciousness: sedated  Airway & Oxygen Therapy: Patient Spontanous Breathing  Post-op Assessment: Report given to RN and Post -op Vital signs reviewed and stable  Post vital signs: Reviewed and stable  Last Vitals:  Vitals Value Taken Time  BP 85/49 09/05/23 1156  Temp    Pulse 65 09/05/23 1157  Resp 15 09/05/23 1157  SpO2 98 % 09/05/23 1157  Vitals shown include unfiled device data.  Last Pain:  Vitals:   09/05/23 1029  TempSrc: Temporal  PainSc: 1          Complications: No notable events documented.

## 2023-09-05 NOTE — Interval H&P Note (Signed)
 History and Physical Interval Note: Preprocedure H&P from 09/05/23  was reviewed and there was no interval change after seeing and examining the patient.  Written consent was obtained from the patient after discussion of risks, benefits, and alternatives. Patient has consented to proceed with Colonoscopy with possible intervention   09/05/2023 11:21 AM  Troy Giles  has presented today for surgery, with the diagnosis of Z86.0101 (ICD-10-CM) - Hx of adenomatous colonic polyps.  The various methods of treatment have been discussed with the patient and family. After consideration of risks, benefits and other options for treatment, the patient has consented to  Procedure(s): COLONOSCOPY (N/A) as a surgical intervention.  The patient's history has been reviewed, patient examined, no change in status, stable for surgery.  I have reviewed the patient's chart and labs.  Questions were answered to the patient's satisfaction.     Quintin Buckle

## 2023-09-05 NOTE — Anesthesia Preprocedure Evaluation (Addendum)
 Anesthesia Evaluation  Patient identified by MRN, date of birth, ID band Patient awake    Reviewed: Allergy & Precautions, H&P , NPO status , Patient's Chart, lab work & pertinent test results, reviewed documented beta blocker date and time   Airway Mallampati: II   Neck ROM: full    Dental  (+) Teeth Intact   Pulmonary neg pulmonary ROS   Pulmonary exam normal        Cardiovascular Exercise Tolerance: Good + Peripheral Vascular Disease  negative cardio ROS Normal cardiovascular exam+ dysrhythmias Atrial Fibrillation and Supra Ventricular Tachycardia  Rhythm:regular Rate:Normal  S/p cardiac oblation   Neuro/Psych negative neurological ROS  negative psych ROS   GI/Hepatic negative GI ROS, Neg liver ROS,GERD  Medicated,,  Endo/Other  negative endocrine ROS    Renal/GU      Musculoskeletal   Abdominal   Peds  Hematology negative hematology ROS (+)   Anesthesia Other Findings Past Medical History: No date: A-fib (HCC) No date: AAA (abdominal aortic aneurysm) (HCC) No date: Allergic state No date: Atrial fibrillation (HCC) No date: Diverticulitis No date: GERD (gastroesophageal reflux disease) No date: SVT (supraventricular tachycardia) (HCC) Past Surgical History: 09/23/2014: COLONOSCOPY; N/A     Comment:  Procedure: COLONOSCOPY;  Surgeon: Cassie Click, MD;               Location[facility] ENDOSCOPY;  Service: Endoscopy;                Laterality: N/A; BMI    Body Mass Index:  32.32 kg/m     Reproductive/Obstetrics negative OB ROS                             Anesthesia Physical Anesthesia Plan  ASA: 3  Anesthesia Plan: General   Post-op Pain Management: Minimal or no pain anticipated   Induction: Intravenous  PONV Risk Score and Plan: 3 and Propofol infusion, TIVA and Ondansetron  Airway Management Planned: Nasal Cannula  Additional Equipment: None  Intra-op Plan:    Post-operative Plan:   Informed Consent: I have reviewed the patients History and Physical, chart, labs and discussed the procedure including the risks, benefits and alternatives for the proposed anesthesia with the patient or authorized representative who has indicated his/her understanding and acceptance.     Dental advisory given  Plan Discussed with: CRNA and Surgeon  Anesthesia Plan Comments: (Discussed risks of anesthesia with patient, including possibility of difficulty with spontaneous ventilation under anesthesia necessitating airway intervention, PONV, and rare risks such as cardiac or respiratory or neurological events, and allergic reactions. Discussed the role of CRNA in patient's perioperative care. Patient understands.)       Anesthesia Quick Evaluation

## 2023-09-06 ENCOUNTER — Encounter: Payer: Self-pay | Admitting: Gastroenterology

## 2023-09-06 LAB — SURGICAL PATHOLOGY

## 2023-11-28 ENCOUNTER — Other Ambulatory Visit: Payer: BLUE CROSS/BLUE SHIELD

## 2024-01-17 ENCOUNTER — Ambulatory Visit
Admission: RE | Admit: 2024-01-17 | Discharge: 2024-01-17 | Disposition: A | Discharge status: Home / Self Care | Payer: Self-pay | Source: Ambulatory Visit | Attending: Internal Medicine | Admitting: Internal Medicine

## 2024-01-17 DIAGNOSIS — I471 Supraventricular tachycardia, unspecified: Secondary | ICD-10-CM | POA: Insufficient documentation

## 2024-03-06 ENCOUNTER — Other Ambulatory Visit: Payer: Self-pay | Admitting: Internal Medicine

## 2024-03-06 DIAGNOSIS — R931 Abnormal findings on diagnostic imaging of heart and coronary circulation: Secondary | ICD-10-CM

## 2024-03-06 DIAGNOSIS — I471 Supraventricular tachycardia, unspecified: Secondary | ICD-10-CM

## 2024-03-30 ENCOUNTER — Telehealth (HOSPITAL_COMMUNITY): Payer: Self-pay | Admitting: *Deleted

## 2024-03-30 ENCOUNTER — Telehealth (HOSPITAL_COMMUNITY): Payer: Self-pay | Admitting: Emergency Medicine

## 2024-03-30 NOTE — Telephone Encounter (Signed)
 Patient returning call about his upcoming cardiac imaging study; pt verbalizes understanding of appt date/time, parking situation and where to check in, pre-test NPO status and medications ordered, and verified current allergies; name and call back number provided for further questions should they arise  Chantal Requena RN Navigator Cardiac Imaging [facility] [phone] office [phone] cell  Patient to take 25mg  metoprolol tartrate two hours prior to his cardiac CT scan.

## 2024-03-30 NOTE — Telephone Encounter (Signed)
 Attempted to call patient regarding upcoming cardiac CT appointment. Left message on voicemail with name and callback number Rockwell Alexandria RN Navigator Cardiac [facility] [phone] Office [phone] Cell

## 2024-04-02 ENCOUNTER — Ambulatory Visit
Admission: RE | Admit: 2024-04-02 | Discharge: 2024-04-02 | Disposition: A | Discharge status: Home / Self Care | Source: Ambulatory Visit | Attending: Internal Medicine | Admitting: Internal Medicine

## 2024-04-02 DIAGNOSIS — R931 Abnormal findings on diagnostic imaging of heart and coronary circulation: Secondary | ICD-10-CM | POA: Diagnosis present

## 2024-04-02 DIAGNOSIS — I471 Supraventricular tachycardia, unspecified: Secondary | ICD-10-CM | POA: Insufficient documentation

## 2024-04-02 MED ORDER — DILTIAZEM HCL 25 MG/5ML IV SOLN
10.0000 mg | INTRAVENOUS | Status: DC | PRN
  Filled 2024-04-02: qty 5

## 2024-04-02 MED ORDER — METOPROLOL TARTRATE 5 MG/5ML IV SOLN
INTRAVENOUS | Status: AC
  Filled 2024-04-02: qty 10

## 2024-04-02 MED ORDER — IOHEXOL 350 MG/ML SOLN
100.0000 mL | Freq: Once | INTRAVENOUS | Status: AC | PRN
  Administered 2024-04-02: 100 mL via INTRAVENOUS

## 2024-04-02 MED ORDER — NITROGLYCERIN 0.4 MG SL SUBL
0.8000 mg | SUBLINGUAL_TABLET | Freq: Once | SUBLINGUAL | Status: AC
  Administered 2024-04-02: 0.8 mg via SUBLINGUAL

## 2024-04-02 MED ORDER — METOPROLOL TARTRATE 5 MG/5ML IV SOLN
10.0000 mg | Freq: Once | INTRAVENOUS | Status: AC | PRN
  Administered 2024-04-02: 10 mg via INTRAVENOUS
  Filled 2024-04-02: qty 10

## 2024-04-02 NOTE — Progress Notes (Signed)
 Patient tolerated CT well. Vital signs stable encourage to drink water throughout day.Reasons explained and verbalized understanding. Ambulated steady gait.
